# Patient Record
Sex: Male | Born: 1950 | Hispanic: No | Marital: Married | Smoking: Never smoker
Health system: Southern US, Community
[De-identification: ages and names within clinical notes are randomized; demographics above are authoritative.]

---

## 2019-09-23 ENCOUNTER — Other Ambulatory Visit: Payer: Self-pay | Admitting: Emergency Medicine

## 2019-09-23 DIAGNOSIS — Z20822 Contact with and (suspected) exposure to covid-19: Secondary | ICD-10-CM

## 2019-09-24 LAB — NOVEL CORONAVIRUS, NAA: SARS-CoV-2, NAA: NOT DETECTED

## 2021-02-23 ENCOUNTER — Emergency Department (HOSPITAL_BASED_OUTPATIENT_CLINIC_OR_DEPARTMENT_OTHER): Payer: 59

## 2021-02-23 ENCOUNTER — Telehealth (HOSPITAL_BASED_OUTPATIENT_CLINIC_OR_DEPARTMENT_OTHER): Payer: Self-pay | Admitting: Emergency Medicine

## 2021-02-23 ENCOUNTER — Emergency Department (HOSPITAL_BASED_OUTPATIENT_CLINIC_OR_DEPARTMENT_OTHER)
Admission: EM | Admit: 2021-02-23 | Discharge: 2021-02-23 | Disposition: A | Discharge status: Home / Self Care | Payer: 59 | Attending: Emergency Medicine | Admitting: Emergency Medicine

## 2021-02-23 ENCOUNTER — Encounter (HOSPITAL_BASED_OUTPATIENT_CLINIC_OR_DEPARTMENT_OTHER): Payer: Self-pay | Admitting: Emergency Medicine

## 2021-02-23 ENCOUNTER — Other Ambulatory Visit: Payer: Self-pay

## 2021-02-23 DIAGNOSIS — Z20822 Contact with and (suspected) exposure to covid-19: Secondary | ICD-10-CM | POA: Insufficient documentation

## 2021-02-23 DIAGNOSIS — J069 Acute upper respiratory infection, unspecified: Secondary | ICD-10-CM | POA: Insufficient documentation

## 2021-02-23 DIAGNOSIS — R059 Cough, unspecified: Secondary | ICD-10-CM | POA: Diagnosis present

## 2021-02-23 LAB — RESP PANEL BY RT-PCR (FLU A&B, COVID) ARPGX2
Influenza A by PCR: NEGATIVE
Influenza B by PCR: NEGATIVE
SARS Coronavirus 2 by RT PCR: NEGATIVE

## 2021-02-23 MED ORDER — ALBUTEROL SULFATE HFA 108 (90 BASE) MCG/ACT IN AERS: 1.0000 | INHALATION_SPRAY | Freq: Four times a day (QID) | RESPIRATORY_TRACT | 0 refills | Status: AC | PRN

## 2021-02-23 MED ORDER — AZITHROMYCIN 250 MG PO TABS: 250.0000 mg | ORAL_TABLET | Freq: Every day | ORAL | 0 refills | Status: AC

## 2021-02-23 MED ORDER — ALBUTEROL SULFATE HFA 108 (90 BASE) MCG/ACT IN AERS: 1.0000 | INHALATION_SPRAY | Freq: Four times a day (QID) | RESPIRATORY_TRACT | 0 refills | Status: DC | PRN

## 2021-02-23 MED ORDER — BENZONATATE 100 MG PO CAPS: 100.0000 mg | ORAL_CAPSULE | Freq: Three times a day (TID) | ORAL | 0 refills | Status: AC | PRN

## 2021-02-23 MED ORDER — BENZONATATE 100 MG PO CAPS: 100.0000 mg | ORAL_CAPSULE | Freq: Three times a day (TID) | ORAL | 0 refills | Status: DC | PRN

## 2021-02-23 MED ORDER — AZITHROMYCIN 250 MG PO TABS: 250.0000 mg | ORAL_TABLET | Freq: Every day | ORAL | 0 refills | Status: DC

## 2021-02-23 NOTE — ED Provider Notes (Signed)
Emergency Department Provider Note   I have reviewed the triage vital signs and the nursing notes.   HISTORY  Chief Complaint Cough   HPI Vincent Reed is a 70 y.o. male with past medical history reviewed below presents to the emergency department with cough.  He has had a virtual visit with his PCP 2 weeks ago and sent for Covid testing which was negative.  He has been on doxycycline with concern for possible sinusitis.  He continues to have cough with some associated chest discomfort that is present only with coughing.  He does not have pain or pressure at rest.  Denies shortness of breath.  No tobacco history.  No fevers or chills.  No leg swelling.  No radiation of symptoms or other modifying factors.  History reviewed. No pertinent past medical history.  There are no problems to display for this patient.   History reviewed. No pertinent surgical history.  Allergies Patient has no known allergies.  No family history on file.  Social History Social History   Tobacco Use  . Smoking status: Never Smoker  . Smokeless tobacco: Never Used  Vaping Use  . Vaping Use: Never used  Substance Use Topics  . Alcohol use: Not Currently  . Drug use: Never    Review of Systems  Constitutional: No fever/chills Eyes: No visual changes. ENT: No sore throat. Cardiovascular: Denies chest pain. Respiratory: Denies shortness of breath. Positive cough.  Gastrointestinal: No abdominal pain.  No nausea, no vomiting.  No diarrhea.  No constipation. Genitourinary: Negative for dysuria. Musculoskeletal: Negative for back pain. Skin: Negative for rash. Neurological: Negative for headaches, focal weakness or numbness.  10-point ROS otherwise negative.  ____________________________________________   PHYSICAL EXAM:  VITAL SIGNS: ED Triage Vitals  Enc Vitals Group     BP 02/23/21 1018 (!) 160/79     Pulse Rate 02/23/21 1018 84     Resp 02/23/21 1018 (!) 22     Temp  02/23/21 1018 98.1 F (36.7 C)     Temp Source 02/23/21 1018 Oral     SpO2 02/23/21 1018 95 %     Weight 02/23/21 1020 131 lb 3.2 oz (59.5 kg)     Height 02/23/21 1020 5\' 2"  (1.575 m)   Constitutional: Alert and oriented. Well appearing and in no acute distress. Eyes: Conjunctivae are normal.  Head: Atraumatic. Nose: No congestion/rhinnorhea. Mouth/Throat: Mucous membranes are moist.   Neck: No stridor. Cardiovascular: Normal rate, regular rhythm. Good peripheral circulation. Grossly normal heart sounds.   Respiratory: Normal respiratory effort.  No retractions. Lungs without wheezing or rales. Some mild rhonchi noted bilaterally. Symmetrical exam.  Gastrointestinal: Soft and nontender. No distention.  Musculoskeletal: No lower extremity tenderness nor edema. No gross deformities of extremities. Neurologic:  Normal speech and language. No gross focal neurologic deficits are appreciated.  Skin:  Skin is warm, dry and intact. No rash noted.  ____________________________________________   LABS (all labs ordered are listed, but only abnormal results are displayed)  Labs Reviewed  RESP PANEL BY RT-PCR (FLU A&B, COVID) [id number]   ____________________________________________  RADIOLOGY  DG Chest Portable 1 View  Result Date: 02/23/2021 CLINICAL DATA:  Cough for 1 month EXAM: PORTABLE CHEST 1 VIEW COMPARISON:  None. FINDINGS: The heart size and mediastinal contours are within normal limits. Subtle interstitial opacities most pronounced within the peripheral aspects of the mid to lower lung fields, left slightly greater than right. No pleural effusion or pneumothorax. The visualized skeletal structures are unremarkable. IMPRESSION: Subtle  interstitial opacities most pronounced within the peripheral aspects of the mid to lower lung fields, left slightly greater than right. Findings suspicious for atypical/viral pneumonia (including COVID-19 pneumonia). Electronically Signed   By: Duanne Guess D.O.   On: 02/23/2021 11:46    ____________________________________________   PROCEDURES  Procedure(s) performed:   Procedures  None  ____________________________________________   INITIAL IMPRESSION / ASSESSMENT AND PLAN / ED COURSE  Pertinent labs & imaging results that were available during my care of the patient were reviewed by me and considered in my medical decision making (see chart for details).   Patient presents the emergency department with persistent cough.  He has mild rhonchi on exam but exam is symmetrical.  He is not hypoxemic or tachypneic.  He has some discomfort with cough only but no lingering chest pain or pressure.  My suspicion for ACS/PE is very low.  His Covid test came back negative although that was 2 weeks ago.  Plan to repeat the PCR test here along with rapid flu.  Will need chest x-ray to rule out pneumonia.   CXR read reviewed and film reviewed by me personally. Question atypical PNA vs recent COVID infection. COVID PCR and Flu are negative here. No hypoxemia with ambulation. No increased WOB. Patient's main symptom is cough. COVID PCR two weeks prior was also negative. Plan for second round of abx today along with albuterol inh and tessalon. Patient to f/u w/ PCP in 1-2 weeks for follow up.  ____________________________________________  FINAL CLINICAL IMPRESSION(S) / ED DIAGNOSES  Final diagnoses:  Viral URI with cough    NEW OUTPATIENT MEDICATIONS STARTED DURING THIS VISIT:  Discharge Medication List as of 02/23/2021  1:24 PM    START taking these medications   Details  albuterol (VENTOLIN HFA) 108 (90 Base) MCG/ACT inhaler Inhale 1-2 puffs into the lungs every 6 (six) hours as needed for wheezing or shortness of breath., Starting Sat 02/23/2021, Normal    azithromycin (ZITHROMAX) 250 MG tablet Take 1 tablet (250 mg total) by mouth daily. Take first 2 tablets together, then 1 every day until finished., Starting Sat 02/23/2021, Normal     benzonatate (TESSALON) 100 MG capsule Take 1 capsule (100 mg total) by mouth 3 (three) times daily as needed for cough., Starting Sat 02/23/2021, Normal        Note:  This document was prepared using Dragon voice recognition software and may include unintentional dictation errors.  Alona Bene, MD, [facility] Emergency Medicine    Etter Royall, Arlyss Repress, MD 02/23/21 1444

## 2021-02-23 NOTE — ED Triage Notes (Addendum)
Pt brought in for dry cough x 1 month. Pt seen by PCP couple weeks ago by video visit for same and was prescribed medication, but is not working. Patient has appointment with PCP in April. Covid test 2 weeks ago was negative. Pt had COVID vaccine x 2.

## 2021-02-23 NOTE — Telephone Encounter (Signed)
6:08 PM Patient reportedly could not pick up his prescriptions after discharge as the pharmacy had closed.  They were subsequently called into a 24-hour pharmacy for him to pick up.

## 2021-02-23 NOTE — ED Notes (Signed)
O2 sat 95-98% while ambulating

## 2021-02-23 NOTE — Discharge Instructions (Signed)
You were seen in the emergency department today with persistent cough.  Your x-ray shows possible viral type pneumonia or possibly recovering from Covid infection.  Your Covid test and flu test today were negative.  Because of this, I am starting you on an additional antibiotic for the next several days along with an albuterol inhaler and some cough medication.  The cough can linger for several weeks even with treatment.  If you develop chest pain, shortness of breath, other sudden/severe symptoms you should return to the emergency department.  Please call your primary care doctor next week for follow-up.

## 2022-01-24 IMAGING — DX DG CHEST 1V PORT
1 series · 1 of 1 positions shown · non-contrast
Comparison: None.

CLINICAL DATA: Cough for 1 month

EXAM:
PORTABLE CHEST 1 VIEW

[chest ap]
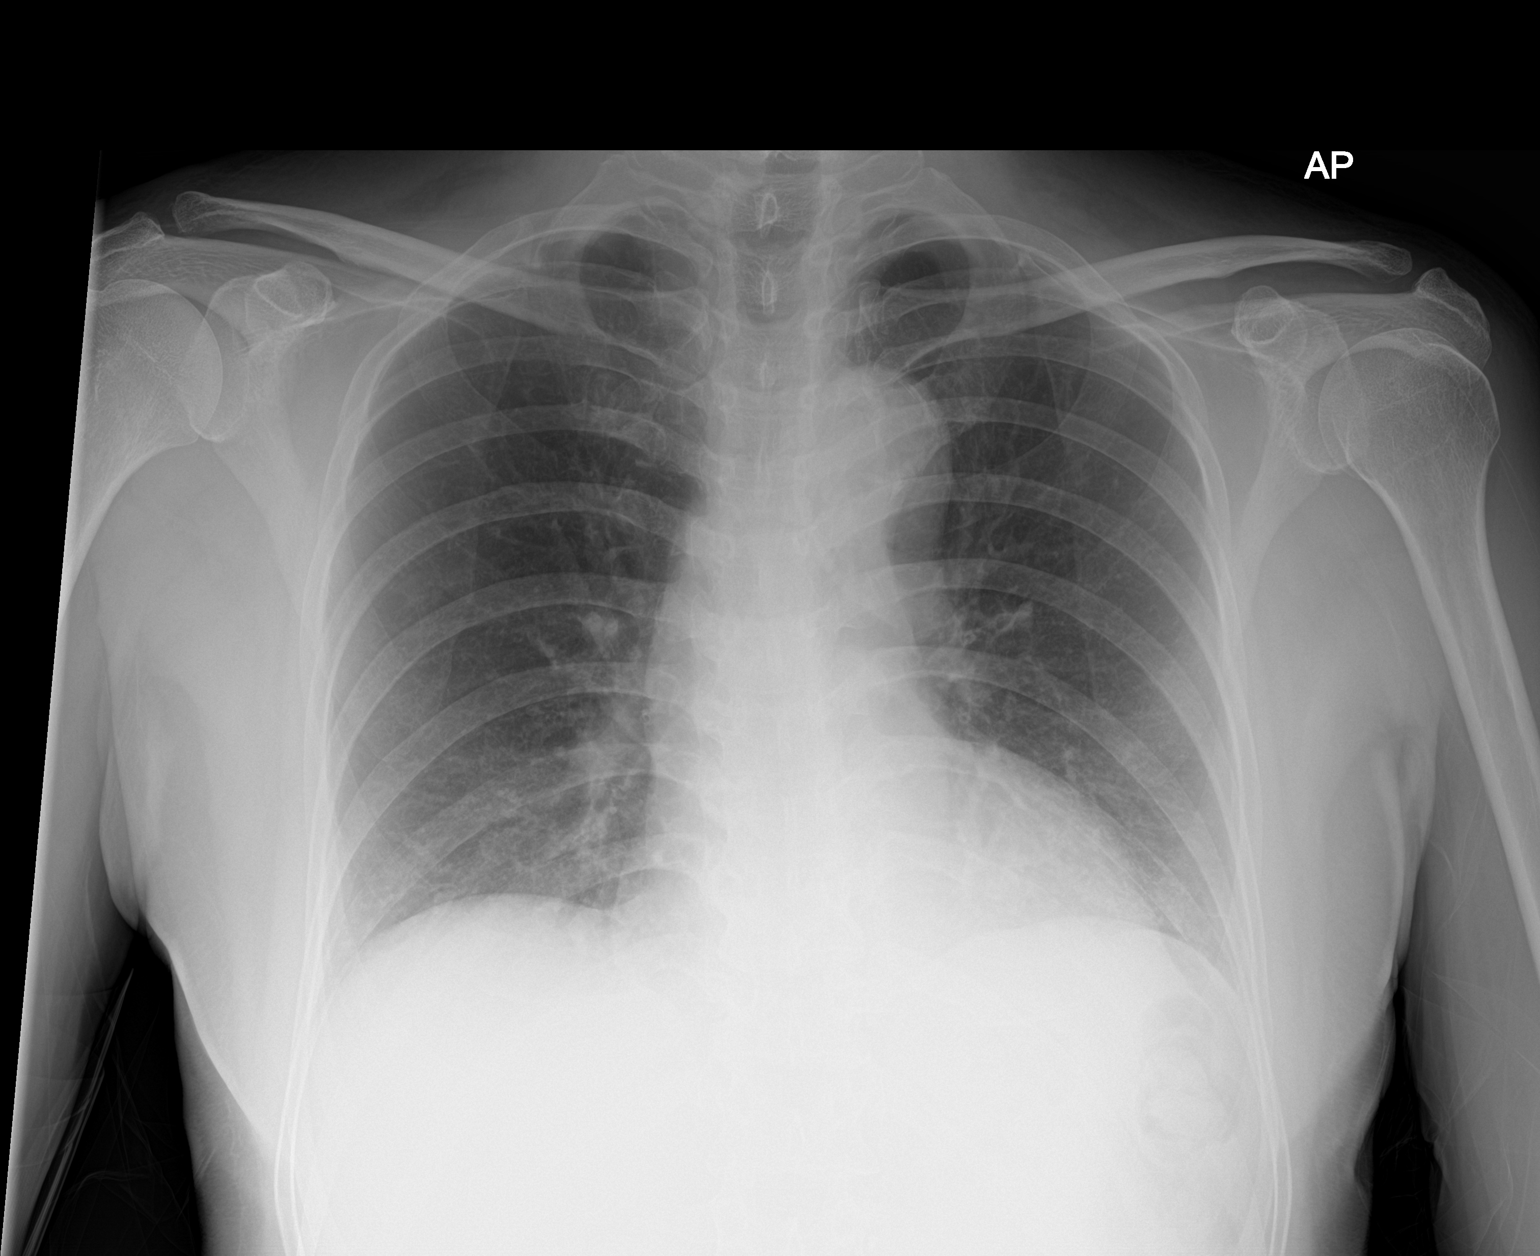

[1 of 1 positions shown; findings below may reference images not displayed]

FINDINGS: The heart size and mediastinal contours are within normal limits.
Subtle interstitial opacities most pronounced within the peripheral
aspects of the mid to lower lung fields, left slightly greater than
right. No pleural effusion or pneumothorax. The visualized skeletal
structures are unremarkable.
IMPRESSION: Subtle interstitial opacities most pronounced within the peripheral
aspects of the mid to lower lung fields, left slightly greater than
right. Findings suspicious for atypical/viral pneumonia (including
[id number] pneumonia).
